# Patient Record
Sex: Female | Born: 1995 | Race: White | Hispanic: No | Marital: Single | Smoking: Former smoker
Health system: Southern US, Community
[De-identification: ages and names within clinical notes are randomized; demographics above are authoritative.]

## PROBLEM LIST (undated history)

## (undated) DIAGNOSIS — J302 Other seasonal allergic rhinitis: Secondary | ICD-10-CM

## (undated) DIAGNOSIS — J45909 Unspecified asthma, uncomplicated: Secondary | ICD-10-CM

---
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
## 2015-09-05 ENCOUNTER — Encounter (HOSPITAL_COMMUNITY): Payer: Self-pay | Admitting: Emergency Medicine

## 2015-09-05 ENCOUNTER — Emergency Department (HOSPITAL_COMMUNITY): Payer: Medicaid Other

## 2015-09-05 ENCOUNTER — Emergency Department (HOSPITAL_COMMUNITY)
Admission: EM | Admit: 2015-09-05 | Discharge: 2015-09-05 | Disposition: A | Discharge status: Home / Self Care | Payer: Medicaid Other | Attending: Emergency Medicine | Admitting: Emergency Medicine

## 2015-09-05 DIAGNOSIS — Z3202 Encounter for pregnancy test, result negative: Secondary | ICD-10-CM | POA: Insufficient documentation

## 2015-09-05 DIAGNOSIS — M62830 Muscle spasm of back: Secondary | ICD-10-CM

## 2015-09-05 DIAGNOSIS — Z79899 Other long term (current) drug therapy: Secondary | ICD-10-CM | POA: Insufficient documentation

## 2015-09-05 DIAGNOSIS — E739 Lactose intolerance, unspecified: Secondary | ICD-10-CM | POA: Diagnosis not present

## 2015-09-05 DIAGNOSIS — R109 Unspecified abdominal pain: Secondary | ICD-10-CM | POA: Diagnosis present

## 2015-09-05 DIAGNOSIS — R1013 Epigastric pain: Secondary | ICD-10-CM | POA: Insufficient documentation

## 2015-09-05 DIAGNOSIS — J45909 Unspecified asthma, uncomplicated: Secondary | ICD-10-CM | POA: Insufficient documentation

## 2015-09-05 LAB — URINALYSIS, ROUTINE W REFLEX MICROSCOPIC
Bilirubin Urine: NEGATIVE
GLUCOSE, UA: NEGATIVE mg/dL
Leukocytes, UA: NEGATIVE
Nitrite: NEGATIVE
PROTEIN: NEGATIVE mg/dL
SPECIFIC GRAVITY, URINE: 1.015 (ref 1.005–1.030)
pH: 7 (ref 5.0–8.0)

## 2015-09-05 LAB — CBC WITH DIFFERENTIAL/PLATELET
Basophils Absolute: 0 10*3/uL (ref 0.0–0.1)
Basophils Relative: 0 %
Eosinophils Absolute: 0.1 10*3/uL (ref 0.0–0.7)
Eosinophils Relative: 1 %
HEMATOCRIT: 40.1 % (ref 36.0–46.0)
HEMOGLOBIN: 13.3 g/dL (ref 12.0–15.0)
LYMPHS PCT: 32 %
Lymphs Abs: 3.3 10*3/uL (ref 0.7–4.0)
MCH: 28.2 pg (ref 26.0–34.0)
MCHC: 33.2 g/dL (ref 30.0–36.0)
MCV: 85.1 fL (ref 78.0–100.0)
MONO ABS: 0.5 10*3/uL (ref 0.1–1.0)
MONOS PCT: 5 %
NEUTROS ABS: 6.3 10*3/uL (ref 1.7–7.7)
Neutrophils Relative %: 62 %
Platelets: 312 10*3/uL (ref 150–400)
RBC: 4.71 MIL/uL (ref 3.87–5.11)
RDW: 12.7 % (ref 11.5–15.5)
WBC: 10.2 10*3/uL (ref 4.0–10.5)

## 2015-09-05 LAB — COMPREHENSIVE METABOLIC PANEL
ALBUMIN: 4.4 g/dL (ref 3.5–5.0)
ALT: 25 U/L (ref 14–54)
AST: 21 U/L (ref 15–41)
Alkaline Phosphatase: 71 U/L (ref 38–126)
Anion gap: 7 (ref 5–15)
BUN: 10 mg/dL (ref 6–20)
CHLORIDE: 106 mmol/L (ref 101–111)
CO2: 27 mmol/L (ref 22–32)
Calcium: 9.3 mg/dL (ref 8.9–10.3)
Creatinine, Ser: 0.81 mg/dL (ref 0.44–1.00)
GFR calc Af Amer: >60 mL/min (ref >60)
GFR calc non Af Amer: >60 mL/min (ref >60)
GLUCOSE: 107 mg/dL — AB (ref 65–99)
POTASSIUM: 3.4 mmol/L — AB (ref 3.5–5.1)
SODIUM: 140 mmol/L (ref 135–145)
Total Bilirubin: 0.5 mg/dL (ref 0.3–1.2)
Total Protein: 7.7 g/dL (ref 6.5–8.1)

## 2015-09-05 LAB — URINE MICROSCOPIC-ADD ON

## 2015-09-05 LAB — LIPASE, BLOOD: Lipase: 32 U/L (ref 11–51)

## 2015-09-05 LAB — POC URINE PREG, ED: Preg Test, Ur: NEGATIVE

## 2015-09-05 MED ORDER — ONDANSETRON HCL 4 MG/2ML IJ SOLN
4.0000 mg | Freq: Once | INTRAMUSCULAR | Status: AC
  Administered 2015-09-05: 4 mg via INTRAVENOUS
  Filled 2015-09-05: qty 2

## 2015-09-05 MED ORDER — SODIUM CHLORIDE 0.9 % IV BOLUS (SEPSIS)
1000.0000 mL | Freq: Once | INTRAVENOUS | Status: AC
  Administered 2015-09-05: 1000 mL via INTRAVENOUS

## 2015-09-05 MED ORDER — KETOROLAC TROMETHAMINE 30 MG/ML IJ SOLN
30.0000 mg | Freq: Once | INTRAMUSCULAR | Status: AC
  Administered 2015-09-05: 30 mg via INTRAVENOUS
  Filled 2015-09-05: qty 1

## 2015-09-05 MED ORDER — CYCLOBENZAPRINE HCL 10 MG PO TABS
10.0000 mg | ORAL_TABLET | Freq: Once | ORAL | Status: AC
  Administered 2015-09-05: 10 mg via ORAL
  Filled 2015-09-05: qty 1

## 2015-09-05 MED ORDER — NAPROXEN 500 MG PO TABS: ORAL_TABLET | ORAL | Status: DC

## 2015-09-05 MED ORDER — CYCLOBENZAPRINE HCL 5 MG PO TABS: 5.0000 mg | ORAL_TABLET | Freq: Three times a day (TID) | ORAL | Status: DC | PRN

## 2015-09-05 NOTE — ED Notes (Signed)
Pt c/o left flank pain that radiates to the abd since earlier tonight.

## 2015-09-05 NOTE — Discharge Instructions (Signed)
Use ice and heat over the sore muscles in your back. Take the medications as prescribed. Look at the back injury prevention instructions. Start taking lactaid when you ingest food or drink with dairy products in them. Recheck if you get a fever, your pain seems worse, you have uncontrolled vomiting or you seem worse.    Back Injury Prevention Back injuries can be very painful. They can also be difficult to heal. After having one back injury, you are more likely to injure your back again. It is important to learn how to avoid injuring or re-injuring your back. The following tips can help you to prevent a back injury. WHAT SHOULD I KNOW ABOUT PHYSICAL FITNESS?  Exercise for 30 minutes per day on most days of the week or as told by your doctor. Make sure to:  Do aerobic exercises, such as walking, jogging, biking, or swimming.  Do exercises that increase balance and strength, such as tai chi and yoga.  Do stretching exercises. This helps with flexibility.  Try to develop strong belly (abdominal) muscles. Your belly muscles help to support your back.  Stay at a healthy weight. This helps to decrease your risk of a back injury. WHAT SHOULD I KNOW ABOUT MY DIET?  Talk with your doctor about your overall diet. Take supplements and vitamins only as told by your doctor.  Talk with your doctor about how much calcium and vitamin D you need each day. These nutrients help to prevent weakening of the bones (osteoporosis).  Include good sources of calcium in your diet, such as:  Dairy products.  Green leafy vegetables.  Products that have had calcium added to them (fortified).  Include good sources of vitamin D in your diet, such as:  Milk.  Foods that have had vitamin D added to them. WHAT SHOULD I KNOW ABOUT MY POSTURE?  Sit up straight and stand up straight. Avoid leaning forward when you sit or hunching over when you stand.  Choose chairs that have good low-back (lumbar) support.  If  you work at a desk, sit close to it so you do not need to lean over. Keep your chin tucked in. Keep your neck drawn back. Keep your elbows bent so your arms look like the letter "L" (right angle).  Sit high and close to the steering wheel when you drive. Add a low-back support to your car seat, if needed.  Avoid sitting or standing in one position for very long. Take breaks to get up, stretch, and walk around at least one time every hour. Take breaks every hour if you are driving for long periods of time.  Sleep on your side with your knees slightly bent, or sleep on your back with a pillow under your knees. Do not lie on the front of your body to sleep. WHAT SHOULD I KNOW ABOUT LIFTING, TWISTING, AND REACHING Lifting and Heavy Lifting  Avoid heavy lifting, especially lifting over and over again. If you must do heavy lifting:  Stretch before lifting.  Work slowly.  Rest between lifts.  Use a tool such as a cart or a dolly to move objects if one is available.  Make several small trips instead of carrying one heavy load.  Ask for help when you need it, especially when moving big objects.  Follow these steps when lifting:  Stand with your feet shoulder-width apart.  Get as close to the object as you can. Do not pick up a heavy object that is far from your  body.  Use handles or lifting straps if they are available.  Bend at your knees. Squat down, but keep your heels off the floor.  Keep your shoulders back. Keep your chin tucked in. Keep your back straight.  Lift the object slowly while you tighten the muscles in your legs, belly, and butt. Keep the object as close to the center of your body as possible.  Follow these steps when putting down a heavy load:  Stand with your feet shoulder-width apart.  Lower the object slowly while you tighten the muscles in your legs, belly, and butt. Keep the object as close to the center of your body as possible.  Keep your shoulders back.  Keep your chin tucked in. Keep your back straight.  Bend at your knees. Squat down, but keep your heels off the floor.  Use handles or lifting straps if they are available. Twisting and Reaching  Avoid lifting heavy objects above your waist.  Do not twist at your waist while you are lifting or carrying a load. If you need to turn, move your feet.  Do not bend over without bending at your knees.  Avoid reaching over your head, across a table, or for an object on a high surface.  WHAT ARE SOME OTHER TIPS?  Avoid wet floors and icy ground. Keep sidewalks clear of ice to prevent falls.   Do not sleep on a mattress that is too soft or too hard.   Keep items that you use often within easy reach.   Put heavier objects on shelves at waist level, and put lighter objects on lower or higher shelves.  Find ways to lower your stress, such as:  Exercise.  Massage.  Relaxation techniques.  Talk with your doctor if you feel anxious or depressed. These conditions can make back pain worse.  Wear flat heel shoes with cushioned soles.  Avoid making quick (sudden) movements.  Use both shoulder straps when carrying a backpack.  Do not use any tobacco products, including cigarettes, chewing tobacco, or electronic cigarettes. If you need help quitting, ask your doctor.   This information is not intended to replace advice given to you by your health care provider. Make sure you discuss any questions you have with your health care provider.   Document Released: 02/14/2008 Document Revised: 01/12/2015 Document Reviewed: 09/01/2014 Elsevier Interactive Patient Education 2016 [place].  Back Exercises If you have pain in your back, do these exercises 2-3 times each day or as told by your doctor. When the pain goes away, do the exercises once each day, but repeat the steps more times for each exercise (do more repetitions). If you do not have pain in your back, do these exercises once  each day or as told by your doctor. EXERCISES Single Knee to Chest Do these steps 3-5 times in a row for each leg:  Lie on your back on a firm bed or the floor with your legs stretched out.  Bring one knee to your chest.  Hold your knee to your chest by grabbing your knee or thigh.  Pull on your knee until you feel a gentle stretch in your lower back.  Keep doing the stretch for 10-30 seconds.  Slowly let go of your leg and straighten it. Pelvic Tilt Do these steps 5-10 times in a row:  Lie on your back on a firm bed or the floor with your legs stretched out.  Bend your knees so they point up to the ceiling.  Your feet should be flat on the floor.  Tighten your lower belly (abdomen) muscles to press your lower back against the floor. This will make your tailbone point up to the ceiling instead of pointing down to your feet or the floor.  Stay in this position for 5-10 seconds while you gently tighten your muscles and breathe evenly. Cat-Cow Do these steps until your lower back bends more easily:  Get on your hands and knees on a firm surface. Keep your hands under your shoulders, and keep your knees under your hips. You may put padding under your knees.  Let your head hang down, and make your tailbone point down to the floor so your lower back is round like the back of a cat.  Stay in this position for 5 seconds.  Slowly lift your head and make your tailbone point up to the ceiling so your back hangs low (sags) like the back of a cow.  Stay in this position for 5 seconds. Press-Ups Do these steps 5-10 times in a row:  Lie on your belly (face-down) on the floor.  Place your hands near your head, about shoulder-width apart.  While you keep your back relaxed and keep your hips on the floor, slowly straighten your arms to raise the top half of your body and lift your shoulders. Do not use your back muscles. To make yourself more comfortable, you may change where you place your  hands.  Stay in this position for 5 seconds.  Slowly return to lying flat on the floor. Bridges Do these steps 10 times in a row:  Lie on your back on a firm surface.  Bend your knees so they point up to the ceiling. Your feet should be flat on the floor.  Tighten your butt muscles and lift your butt off of the floor until your waist is almost as high as your knees. If you do not feel the muscles working in your butt and the back of your thighs, slide your feet 1-2 inches farther away from your butt.  Stay in this position for 3-5 seconds.  Slowly lower your butt to the floor, and let your butt muscles relax. If this exercise is too easy, try doing it with your arms crossed over your chest. Belly Crunches Do these steps 5-10 times in a row:  Lie on your back on a firm bed or the floor with your legs stretched out.  Bend your knees so they point up to the ceiling. Your feet should be flat on the floor.  Cross your arms over your chest.  Tip your chin a little bit toward your chest but do not bend your neck.  Tighten your belly muscles and slowly raise your chest just enough to lift your shoulder blades a tiny bit off of the floor.  Slowly lower your chest and your head to the floor. Back Lifts Do these steps 5-10 times in a row: 1. Lie on your belly (face-down) with your arms at your sides, and rest your forehead on the floor. 2. Tighten the muscles in your legs and your butt. 3. Slowly lift your chest off of the floor while you keep your hips on the floor. Keep the back of your head in line with the curve in your back. Look at the floor while you do this. 4. Stay in this position for 3-5 seconds. 5. Slowly lower your chest and your face to the floor. GET HELP IF:  Your back pain gets  a lot worse when you do an exercise.  Your back pain does not lessen 2 hours after you exercise. If you have any of these problems, stop doing the exercises. Do not do them again unless your  doctor says it is okay. GET HELP RIGHT AWAY IF:  You have sudden, very bad back pain. If this happens, stop doing the exercises. Do not do them again unless your doctor says it is okay.   This information is not intended to replace advice given to you by your health care provider. Make sure you discuss any questions you have with your health care provider.   Document Released: 09/30/2010 Document Revised: 05/19/2015 Document Reviewed: 10/22/2014 Elsevier Interactive Patient Education 2016 [place] therapy can help ease sore, stiff, injured, and tight muscles and joints. Heat relaxes your muscles, which may help ease your pain. Heat therapy should only be used on old, pre-existing, or long-lasting (chronic) injuries. Do not use heat therapy unless told by your doctor. HOW TO USE HEAT THERAPY There are several different kinds of heat therapy, including:  Moist heat pack.  Warm water bath.  Hot water bottle.  Electric heating pad.  Heated gel pack.  Heated wrap.  Electric heating pad. GENERAL HEAT THERAPY RECOMMENDATIONS   Do not sleep while using heat therapy. Only use heat therapy while you are awake.  Your skin may turn pink while using heat therapy. Do not use heat therapy if your skin turns red.  Do not use heat therapy if you have new pain.  High heat or long exposure to heat can cause burns. Be careful when using heat therapy to avoid burning your skin.  Do not use heat therapy on areas of your skin that are already irritated, such as with a rash or sunburn. GET HELP IF:   You have blisters, redness, swelling (puffiness), or numbness.  You have new pain.  Your pain is worse. MAKE SURE YOU:  Understand these instructions.  Will watch your condition.  Will get help right away if you are not doing well or get worse.   This information is not intended to replace advice given to you by your health care provider. Make sure you discuss any  questions you have with your health care provider.   Document Released: 11/20/2011 Document Revised: 09/18/2014 Document Reviewed: 10/21/2013 Elsevier Interactive Patient Education 2016 [employer] Inc[employer]  Lactose-Free Diet, Adult If you have lactose intolerance, you are not able to digest lactose. Lactose is a natural sugar found mainly in milk and milk products. You may need to avoid all foods and beverages that contain lactose. A lactose-free diet can help you do this.  WHAT DO I NEED TO KNOW ABOUT THIS DIET?  Do not consume foods, beverages, vitamins, minerals, or medicines with lactose. Read ingredients lists carefully.  Look for the words "lactose-free" on labels.  Use lactase enzyme drops or tablets as directed by your health care provider.  Use lactose-free milk or a milk alternative, such as soy milk, for drinking and cooking.  Make sure you get enough calcium and vitamin D in your diet. A lactose-free eating plan can be lacking in these important nutrients.  Take calcium and vitamin D supplements as directed by your health care provider. Talk to your provider about supplements if you are not able to get enough calcium and vitamin D from food. WHICH FOODS HAVE LACTOSE? Lactose is found in:   Milk and foods made from milk.  Yogurt.  Cheese.  Butter.   Margarine.   Sour cream.   Cream.   Whipped toppings and nondairy creamers.  Ice cream and other milk-based desserts. Lactose is also found in foods or products made with milk or milk ingredients. To find out whether a food contains milk or a milk ingredient, look at the ingredients list. Avoid foods with the statement "May contain milk" and foods that contain:   Butter.   Cream.  Milk.  Milk solids.  Milk powder.   Whey.  Curd.  Caseinate.  Lactose.  Lactalbumin.  Lactoglobulin. WHAT ARE SOME ALTERNATIVES TO MILK AND FOODS MADE WITH MILK PRODUCTS?  Lactose-free milk.  Soy milk with  added calcium and vitamin D.  Almond, coconut, or rice milk with added calcium and vitamin D. Note that these are low in protein.   Soy products, such as soy yogurt, soy cheese, soy ice cream, and soy-based sour cream. WHICH FOODS CAN I EAT? Grains Breads and rolls made without milk, such as [place], [place], or [place] bread, bagels, pita, and [employer]. Corn tortillas, corn meal, grits, and polenta. Crackers without lactose or milk solids, such as soda crackers and graham crackers. Cooked or dry cereals without lactose or milk solids. Pasta, quinoa, couscous, barley, oats, bulgur, farro, rice, wild rice, or other grains prepared without milk or lactose. Plain popcorn.  Vegetables Fresh, frozen, and canned vegetables without cheese, cream, or butter sauces. Fruits All fresh, canned, frozen, or dried fruits that are not processed with lactose. Meats and Other Protein Sources Plain beef, chicken, fish, [place], lamb, veal, pork, wild game, or ham. Kosher-prepared meat products. Strained or junior meats that do not contain milk. Eggs. Soy meat substitutes. Beans, lentils, and hummus. Tofu. Nuts and seeds. Peanut or other nut butters without lactose. Soups, casseroles, and mixed dishes without cheese, cream, or milk.  Dairy Lactose-free milk. Soy, rice, or almond milk with added calcium and vitamin D. Soy cheese and yogurt. Beverages Carbonated drinks. Tea. Coffee, freeze-dried coffee, and some instant coffees. Fruit and vegetable juices.  Condiments Soy sauce. Carob powder. Olives. Gravy made with water. Baker's cocoa. Angie Fava. Pure seasonings and spices. Ketchup. Mustard. Bouillon. Broth.  Sweets and Desserts Water and fruit ices. Gelatin. Cookies, pies, or cakes made from allowed ingredients, such as angel food cake. Pudding made with water or a milk substitute. Lactose-free tofu desserts. Soy, coconut milk, or rice-milk-based frozen desserts. Sugar. Honey. Jam, jelly, and marmalade. Molasses. Pure  sugar candy. Dark chocolate without milk. Marshmallows.  Fats and Oils Margarines and salad dressings that do not contain milk. Berniece Salines. Vegetable oils. Shortening. Mayonnaise. Soy or coconut-based cream.  The items listed above may not be a complete list of recommended foods or beverages. Contact your dietitian for more options.  WHICH FOODS ARE NOT RECOMMENDED? Grains Breads and rolls that contain milk. Toaster pastries. Muffins, biscuits, waffles, cornbread, and pancakes. These can be prepared at home, commercial, or from mixes. Sweet rolls, donuts, English muffins, fry bread, lefse, flour tortillas with lactose, or [place] toast made with milk or milk ingredients. Crackers that contain lactose. Corn curls. Cooked or dry cereals with lactose. Vegetables Creamed or breaded vegetables. Vegetables in a cheese or butter sauce or with lactose-containing margarines. Instant potatoes. [place] fries. Scalloped or au gratin potatoes.  Fruits None.  Meats and Other Protein Sources Scrambled eggs, omelets, and souffles that contain milk. Creamed or breaded meat, fish, chicken, or [place]. Sausage products, such as wieners and liver sausage. Cold cuts that contain milk solids. Cheese,  cottage cheese, ricotta cheese, and cheese spreads. Lasagna and macaroni and cheese. Pizza. Peanut or other nut butters with added milk solids. Casseroles or mixed dishes containing milk or cheese.  Dairy All dairy products, including milk, goat's milk, buttermilk, kefir, acidophilus milk, flavored milk, evaporated milk, condensed milk, dulce de [place], eggnog, yogurt, cheese, and cheese spreads.  Beverages Hot chocolate. Cocoa with lactose. Instant iced teas. Powdered fruit drinks. Smoothies made with milk or yogurt.  Condiments Chewing gum that has lactose. Cocoa that has lactose. Spice blends if they contain milk products. Artificial sweeteners that contain lactose. Nondairy creamers.  Sweets and Desserts Ice cream, ice milk,  gelato, sherbet, and frozen yogurt. Custard, pudding, and mousse. Cake, cream pies, cookies, and other desserts containing milk, cream, cream cheese, or milk chocolate. Pie crust made with milk-containing margarine or butter. Reduced-calorie desserts made with a sugar substitute that contains lactose. Toffee and butterscotch. Milk, white, or dark chocolate that contains milk. Fudge. Caramel.  Fats and Oils Margarines and salad dressings that contain milk or cheese. Cream. Half and half. Cream cheese. Sour cream. Chip dips made with sour cream or yogurt.  The items listed above may not be a complete list of foods and beverages to avoid. Contact your dietitian for more information. AM I GETTING ENOUGH CALCIUM? Calcium is found in many foods that contain lactose and is important for bone health. The amount of calcium you need depends on your age:   Adults younger than 50 years: 1000 mg of calcium a day.  Adults older than 50 years: 1200 mg of calcium a day. If you are not getting enough calcium, other calcium sources include:   Orange juice with calcium added. There are 300-350 mg of calcium in 1 cup of orange juice.   Sardines with edible bones. There are 325 mg of calcium in 3 oz of sardines.   Calcium-fortified soy milk. There are 300-400 mg of calcium in 1 cup of calcium-fortified soy milk.  Calcium-fortified rice or almond milk. There are 300 mg of calcium in 1 cup of calcium-fortified rice or almond milk.  Canned salmon with edible bones. There are 180 mg of calcium in 3 oz of canned salmon with edible bones.   Calcium-fortified breakfast cereals. There are [phone] mg of calcium in calcium-fortified breakfast cereals.   Tofu set with calcium sulfate. There are 250 mg of calcium in  cup of tofu set with calcium sulfate.  Spinach, cooked. There are 145 mg of calcium in  cup of cooked spinach.  Edamame, cooked. There are 130 mg of calcium in  cup of cooked edamame.  Collard  greens, cooked. There are 125 mg of calcium in  cup of cooked collard greens.  Kale, frozen or cooked. There are 90 mg of calcium in  cup of cooked or frozen kale.   Almonds. There are 95 mg of calcium in  cup of almonds.  Broccoli, cooked. There are 60 mg of calcium in 1 cup of cooked broccoli.   This information is not intended to replace advice given to you by your health care provider. Make sure you discuss any questions you have with your health care provider.   Document Released: 02/17/2002 Document Revised: 01/12/2015 Document Reviewed: 11/28/2013 Elsevier Interactive Patient Education 2016 Elsevier Inc[employer]  Lactose Intolerance, Adult Lactose is the natural sugar found in milk and milk products, such as cheese and yogurt. Lactose is digested by lactase, an enzyme in your small intestine. Some people do not produce enough lactase  to digest lactose. This is called lactose intolerance. Lactose intolerance is different from milk allergy, which is a more serious reaction to the protein in milk.  CAUSES Causes of lactose intolerance may include:   Normal aging. The ability to produce lactase may decline with age, causing lactose intolerance over time.  Being born without the ability to make lactase.   Digestive diseases such as gastroenteritis or inflammatory bowel disease.  Surgery or injuries to your small intestine.  Infection in your intestines.  Certain antibiotic medicines and cancer treatments. SIGNS AND SYMPTOMS  Lactose intolerance can cause uncomfortable symptoms. These are likely to occur within 30 minutes to 2 hours after eating or drinking foods containing lactose. Symptoms of lactose intolerance may include:  Nausea.  Diarrhea.  Abdominal cramps or pain.  Bloating.   Gas.  DIAGNOSIS  There are several tests your health care provider can do to diagnose lactose intolerance. These tests include a hydrogen breath test and stool acidity test.  TREATMENT  No  treatment can improve your body's ability to produce lactase. However, your symptoms can be controlled by limiting or avoiding milk products and other sources of lactose and adjusting your diet. Lactose-free milk is often tolerated. Lactose digestion may also be improved by adding lactase drops to regular milk or by taking lactase tablets when dairy products are consumed. Tolerance to lactose is individual. Some people may be able to eat or drink small amounts of products with lactose, while other may need to avoid lactose entirely. Talk to your health care provider about what is best for you.  HOME CARE INSTRUCTIONS  Limit or avoidfoods, beverages, and medicines containing lactose as directed by your health care provider.   Read food and medicine labels carefully to avoid products containing lactose, milk solids, casein, or whey.  If you eliminate dairy products, replace the protein, calcium, vitamin D, and other nutrients they contain through other foods. A registered dietitian or your health care provider can help you adjust your diet.  Choose a milk substitute that is fortified with calcium and vitamin D. Be aware that soy milk contains high quality protein, while milks made from nuts or grains contain very little protein.  Use lactase drops or tablets if directed by your health care provider. SEEK MEDICAL CARE IF: You have no relief from your symptoms after eliminating milk products and other sources of lactose.    This information is not intended to replace advice given to you by your health care provider. Make sure you discuss any questions you have with your health care provider.   Document Released: 08/28/2005 Document Revised: 09/18/2014 Document Reviewed: 11/28/2013 Elsevier Interactive Patient Education 2016 [employer] Inc[employer]  Muscle Cramps and Spasms Muscle cramps and spasms are when muscles tighten by themselves. They usually get better within minutes. Muscle cramps are painful.  They are usually stronger and last longer than muscle spasms. Muscle spasms may or may not be painful. They can last a few seconds or much longer. HOME CARE  Drink enough fluid to keep your pee (urine) clear or pale yellow.  Massage, stretch, and relax the muscle.  Use a warm towel, heating pad, or warm shower water on tight muscles.  Place ice on the muscle if it is tender or in pain.  Put ice in a plastic bag.  Place a towel between your skin and the bag.  Leave the ice on for 15-20 minutes, 03-04 times a day.  Only take medicine as told by your doctor. GET  HELP RIGHT AWAY IF:  Your cramps or spasms get worse, happen more often, or do not get better with time. MAKE SURE YOU:  Understand these instructions.  Will watch your condition.  Will get help right away if you are not doing well or get worse.   This information is not intended to replace advice given to you by your health care provider. Make sure you discuss any questions you have with your health care provider.   Document Released: 08/10/2008 Document Revised: 12/23/2012 Document Reviewed: 08/14/2012 Elsevier Interactive Patient Education [employer].

## 2015-09-05 NOTE — ED Provider Notes (Signed)
CSN: [id number]     Arrival date & time 09/05/15  0053 History   First MD Initiated Contact with Patient 09/05/15 0129   Chief Complaint  Patient presents with  . Flank Pain     (Consider location/radiation/quality/duration/timing/severity/associated sxs/prior Treatment) HPI patient reports all day today at work she had some pain in her midline lower back. She states it hurts when she moves. She also states sometimes deep breathing makes it hurt. About 12 midnight she was lying in bed trying to go to sleep and she started getting epigastric pain that then progressed and now is like a band that goes all the way around her upper abdomen and across her back. She states the pain is a tightness like a "stretched muscle". She has had nausea and vomiting without diarrhea. She denies any fever. She states the only thing she ate different today was she ate fudge which she doesn't normally eat about 10 PM. She states she's been noticing the past month whenever she eats dairy products she gets a lot of gas. She states her boyfriend gave her lactaide, a ranitidine, a gas relief pill, and Aleve tonight without significant improvement of her discomfort. She states tinnitus first night she has had this pain.   PCP [facility]  Dr Sherlyn Lick  Past Medical History  Diagnosis Date  . Asthma   . Seasonal allergies    History reviewed. No pertinent past surgical history. History reviewed. No pertinent family history. Social History  Substance Use Topics  . Smoking status: Never Smoker   . Smokeless tobacco: None  . Alcohol Use: Yes   no alcohol tonight Employed Living with boyfriend  OB History    No data available     Review of Systems  All other systems reviewed and are negative.     Allergies  Sulfa antibiotics  Home Medications   Prior to Admission medications   Medication Sig Start Date End Date Taking? Authorizing Provider  albuterol (PROVENTIL HFA;VENTOLIN HFA) 108 (90 BASE) MCG/ACT inhaler  Inhale 2 puffs into the lungs every 6 (six) hours as needed. For shortness of breath    Historical Provider, MD  albuterol (PROVENTIL HFA;VENTOLIN HFA) 108 (90 BASE) MCG/ACT inhaler Inhale 2 puffs into the lungs every 4 (four) hours as needed for wheezing. 07/04/12 08/10/12  Tamika Bush, DO  cyclobenzaprine (FLEXERIL) 5 MG tablet Take 1 tablet (5 mg total) by mouth 3 (three) times daily as needed. 09/05/15   Devoria Albe, MD  dextromethorphan-guaiFENesin (MUCINEX DM) 30-600 MG per 12 hr tablet Take 1 tablet by mouth every 12 (twelve) hours.    Historical Provider, MD  naproxen (NAPROSYN) 500 MG tablet Take 1 po BID with food prn pain 09/05/15   Devoria Albe, MD   BP 146/86 mmHg  Pulse 82  Temp(Src) 98.7 F (37.1 C) (Oral)  Resp 20  Ht  (1.778 m)  Wt 235 lb (106.595 kg)  BMI 33.72 kg/m2  SpO2 100%  Vital signs normal   Physical Exam  Constitutional: She is oriented to person, place, and time. She appears well-developed and well-nourished.  Non-toxic appearance. She does not appear ill. No distress.  HENT:  Head: Normocephalic and atraumatic.  Right Ear: External ear normal.  Left Ear: External ear normal.  Nose: Nose normal. No mucosal edema or rhinorrhea.  Mouth/Throat: Oropharynx is clear and moist and mucous membranes are normal. No dental abscesses or uvula swelling.  Eyes: Conjunctivae and EOM are normal. Pupils are equal, round, and reactive to light.  Neck: Normal range of motion and full passive range of motion without pain. Neck supple.  Cardiovascular: Normal rate, regular rhythm and normal heart sounds.  Exam reveals no gallop and no friction rub.   No murmur heard. Pulmonary/Chest: Effort normal and breath sounds normal. No respiratory distress. She has no wheezes. She has no rhonchi. She has no rales. She exhibits no tenderness and no crepitus.  Abdominal: Soft. Normal appearance and bowel sounds are normal. She exhibits no distension. There is tenderness. There is no  rebound and no guarding.    Tender in epigastric region, band of pain noted as reported by patient.   Musculoskeletal: Normal range of motion. She exhibits no edema or tenderness.       Back:  Moves all extremities well.  Patient has tenderness in her lumbar spine and over her left paraspinous muscles. On range of motion of her waist she has pain in the paraspinous muscles on the left especially with range of motion to left, also on the right but not as intense as when she moves to the left.  Neurological: She is alert and oriented to person, place, and time. She has normal strength. No cranial nerve deficit.  Skin: Skin is warm, dry and intact. No rash noted. No erythema. No pallor.  Psychiatric: She has a normal mood and affect. Her speech is normal and behavior is normal. Her mood appears not anxious.  Nursing note and vitals reviewed.   ED Course  Procedures (including critical care time)  Medications  cyclobenzaprine (FLEXERIL) tablet 10 mg (not administered)  sodium chloride 0.9 % bolus 1,000 mL (1,000 mLs Intravenous New Bag/Given 09/05/15 0226)  ondansetron (ZOFRAN) injection 4 mg (4 mg Intravenous Given 09/05/15 0228)  ketorolac (TORADOL) 30 MG/ML injection 30 mg (30 mg Intravenous Given 09/05/15 0229)   Patient was given IV fluids and given Toradol for her musculoskeletal pain. She also was given Zofran for nausea.  At time of discharge patient states her pain is improved. We discussed her test results. She will be treated for musculoskeletal pain of her left paraspinous muscles of her lumbar spine. We also discussed starting taking Lactaid before she eats foods or drinks to have milk products since she has noticed milk is causing her GI distress. She can follow-up with her PCP if she's not improving.   Labs Review Results for orders placed or performed during the hospital encounter of 09/05/15  Urinalysis, Routine w reflex microscopic (not at [facility])  Result Value Ref Range     Color, Urine YELLOW YELLOW   APPearance CLEAR CLEAR   Specific Gravity, Urine 1.015 1.005 - 1.030   pH 7.0 5.0 - 8.0   Glucose, UA NEGATIVE NEGATIVE mg/dL   Hgb urine dipstick LARGE (A) NEGATIVE   Bilirubin Urine NEGATIVE NEGATIVE   Ketones, ur TRACE (A) NEGATIVE mg/dL   Protein, ur NEGATIVE NEGATIVE mg/dL   Nitrite NEGATIVE NEGATIVE   Leukocytes, UA NEGATIVE NEGATIVE  Urine microscopic-add on  Result Value Ref Range   Squamous Epithelial / LPF 6-30 (A) NONE SEEN   WBC, UA 0-5 0 - 5 WBC/hpf   RBC / HPF TOO NUMEROUS TO COUNT 0 - 5 RBC/hpf   Bacteria, UA MANY (A) NONE SEEN  Comprehensive metabolic panel  Result Value Ref Range   Sodium 140 135 - 145 mmol/L   Potassium 3.4 (L) 3.5 - 5.1 mmol/L   Chloride 106 101 - 111 mmol/L   CO2 27 22 - 32 mmol/L   Glucose, Bld  107 (H) 65 - 99 mg/dL   BUN 10 6 - 20 mg/dL   Creatinine, Ser 1.610.81 0.44 - 1.00 mg/dL   Calcium 9.3 8.9 - 09.610.3 mg/dL   Total Protein 7.7 6.5 - 8.1 g/dL   Albumin 4.4 3.5 - 5.0 g/dL   AST 21 15 - 41 U/L   ALT 25 14 - 54 U/L   Alkaline Phosphatase 71 38 - 126 U/L   Total Bilirubin 0.5 0.3 - 1.2 mg/dL   GFR calc non Af Amer >60 >60 mL/min   GFR calc Af Amer >60 >60 mL/min   Anion gap 7 5 - 15  Lipase, blood  Result Value Ref Range   Lipase 32 11 - 51 U/L  CBC with Differential  Result Value Ref Range   WBC 10.2 4.0 - 10.5 K/uL   RBC 4.71 3.87 - 5.11 MIL/uL   Hemoglobin 13.3 12.0 - 15.0 g/dL   HCT 04.540.1 40.936.0 - 81.146.0 %   MCV 85.1 78.0 - 100.0 fL   MCH 28.2 26.0 - 34.0 pg   MCHC 33.2 30.0 - 36.0 g/dL   RDW 91.412.7 78.211.5 - 95.615.5 %   Platelets 312 150 - 400 K/uL   Neutrophils Relative % 62 %   Neutro Abs 6.3 1.7 - 7.7 K/uL   Lymphocytes Relative 32 %   Lymphs Abs 3.3 0.7 - 4.0 K/uL   Monocytes Relative 5 %   Monocytes Absolute 0.5 0.1 - 1.0 K/uL   Eosinophils Relative 1 %   Eosinophils Absolute 0.1 0.0 - 0.7 K/uL   Basophils Relative 0 %   Basophils Absolute 0.0 0.0 - 0.1 K/uL  POC urine preg, [facility] ([facility])   Result Value Ref Range   Preg Test, Ur NEGATIVE NEGATIVE   Laboratory interpretation all normal except hematuria     Imaging Review Ct Renal Stone Study  09/05/2015  CLINICAL DATA:  19 year old female with intermittent left flank pain EXAM: CT ABDOMEN AND PELVIS WITHOUT CONTRAST TECHNIQUE: Multidetector CT imaging of the abdomen and pelvis was performed following the standard protocol without IV contrast. COMPARISON:  None. FINDINGS: Evaluation of this exam is limited in the absence of intravenous contrast. The visualized lung bases are clear. No intra-abdominal free air or free fluid. Mildly distended gallbladder. No calcified stone or pericholecystic fluid. The liver, pancreas, spleen, and adrenal glands appear unremarkable. There is no hydronephrosis or nephrolithiasis. The urinary bladder appears unremarkable. The uterus and the left ovary appear grossly unremarkable. The right ovary is slightly prominent. No inflammatory changes. There is no evidence of bowel obstruction or inflammation. Normal appendix. The abdominal aorta and IVC appear grossly unremarkable on this noncontrast study. No portal venous gas identified. Multiple small mesenteric lymph nodes noted, nonspecific. There is no adenopathy. The abdominal wall soft tissues appear unremarkable. The osseous structures are intact. IMPRESSION: No hydronephrosis or nephrolithiasis. No evidence of bowel obstruction or inflammation.  Normal appendix. Electronically Signed   By: Elgie CollardArash  Radparvar M.D.   On: 09/05/2015 02:57   I have personally reviewed and evaluated these images and lab results as part of my medical decision-making.   EKG Interpretation None      MDM   Final diagnoses:  Muscle spasm of back  Epigastric abdominal pain  Lactose intolerance in adult   New Prescriptions   CYCLOBENZAPRINE (FLEXERIL) 5 MG TABLET    Take 1 tablet (5 mg total) by mouth 3 (three) times daily as needed.   NAPROXEN (NAPROSYN) 500 MG TABLET  Take 1 po BID with food prn pain  Lactaid OTC  Plan discharge  Devoria Albe, MD, Concha Pyo, MD 09/05/15 [phone]

## 2016-11-08 ENCOUNTER — Other Ambulatory Visit: Payer: Self-pay | Admitting: Obstetrics & Gynecology

## 2016-11-08 ENCOUNTER — Other Ambulatory Visit (HOSPITAL_COMMUNITY)
Admission: RE | Admit: 2016-11-08 | Discharge: 2016-11-08 | Disposition: A | Discharge status: Home / Self Care | Payer: Medicaid Other | Source: Ambulatory Visit | Attending: Obstetrics and Gynecology | Admitting: Obstetrics and Gynecology

## 2016-11-08 DIAGNOSIS — Z01419 Encounter for gynecological examination (general) (routine) without abnormal findings: Secondary | ICD-10-CM | POA: Diagnosis not present

## 2016-11-10 LAB — CYTOLOGY - PAP: DIAGNOSIS: NEGATIVE

## 2017-05-03 ENCOUNTER — Emergency Department (HOSPITAL_COMMUNITY)
Admission: EM | Admit: 2017-05-03 | Discharge: 2017-05-03 | Disposition: A | Discharge status: Home / Self Care | Payer: No Typology Code available for payment source | Attending: Emergency Medicine | Admitting: Emergency Medicine

## 2017-05-03 ENCOUNTER — Encounter (HOSPITAL_COMMUNITY): Payer: Self-pay

## 2017-05-03 DIAGNOSIS — F17200 Nicotine dependence, unspecified, uncomplicated: Secondary | ICD-10-CM | POA: Insufficient documentation

## 2017-05-03 DIAGNOSIS — S39012A Strain of muscle, fascia and tendon of lower back, initial encounter: Secondary | ICD-10-CM | POA: Diagnosis not present

## 2017-05-03 DIAGNOSIS — S3992XA Unspecified injury of lower back, initial encounter: Secondary | ICD-10-CM | POA: Diagnosis present

## 2017-05-03 DIAGNOSIS — R21 Rash and other nonspecific skin eruption: Secondary | ICD-10-CM | POA: Diagnosis not present

## 2017-05-03 DIAGNOSIS — Y999 Unspecified external cause status: Secondary | ICD-10-CM | POA: Diagnosis not present

## 2017-05-03 DIAGNOSIS — Y92410 Unspecified street and highway as the place of occurrence of the external cause: Secondary | ICD-10-CM | POA: Diagnosis not present

## 2017-05-03 DIAGNOSIS — Y9389 Activity, other specified: Secondary | ICD-10-CM | POA: Insufficient documentation

## 2017-05-03 DIAGNOSIS — J45909 Unspecified asthma, uncomplicated: Secondary | ICD-10-CM | POA: Diagnosis not present

## 2017-05-03 MED ORDER — TRIAMCINOLONE ACETONIDE 0.1 % EX CREA: 1.0000 "application " | TOPICAL_CREAM | Freq: Three times a day (TID) | CUTANEOUS | 0 refills | Status: DC

## 2017-05-03 MED ORDER — DIAZEPAM 5 MG PO TABS
10.0000 mg | ORAL_TABLET | Freq: Once | ORAL | Status: AC
  Administered 2017-05-03: 10 mg via ORAL
  Filled 2017-05-03: qty 2

## 2017-05-03 MED ORDER — CYCLOBENZAPRINE HCL 10 MG PO TABS: 10.0000 mg | ORAL_TABLET | Freq: Three times a day (TID) | ORAL | 0 refills | Status: DC

## 2017-05-03 MED ORDER — IBUPROFEN 600 MG PO TABS: 600.0000 mg | ORAL_TABLET | Freq: Four times a day (QID) | ORAL | 0 refills | Status: DC

## 2017-05-03 MED ORDER — IBUPROFEN 800 MG PO TABS
800.0000 mg | ORAL_TABLET | Freq: Once | ORAL | Status: AC
  Administered 2017-05-03: 800 mg via ORAL
  Filled 2017-05-03: qty 1

## 2017-05-03 MED ORDER — ONDANSETRON HCL 4 MG PO TABS
4.0000 mg | ORAL_TABLET | Freq: Once | ORAL | Status: AC
  Administered 2017-05-03: 4 mg via ORAL
  Filled 2017-05-03: qty 1
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
## 2017-05-03 NOTE — ED Notes (Signed)
Pt made aware to return if symptoms worsen or if any life threatening symptoms occur.   

## 2017-05-03 NOTE — Discharge Instructions (Signed)
Your examination favors lumbar area strain. Please use a heating pad to your lower back when sitting and at rest. Please rest your back is much as possible. Use Flexeril 3 times daily for spasm pain. Please use ibuprofen with breakfast, lunch, dinner, and at bedtime for pain and inflammation. Please see Dr.Swinteck Or a member of his team if not improving.

## 2017-05-17 ENCOUNTER — Encounter (HOSPITAL_COMMUNITY): Payer: Self-pay

## 2017-05-17 ENCOUNTER — Emergency Department (HOSPITAL_COMMUNITY)
Admission: EM | Admit: 2017-05-17 | Discharge: 2017-05-17 | Disposition: A | Discharge status: Home / Self Care | Payer: Self-pay | Attending: Emergency Medicine | Admitting: Emergency Medicine

## 2017-05-17 DIAGNOSIS — Y929 Unspecified place or not applicable: Secondary | ICD-10-CM | POA: Insufficient documentation

## 2017-05-17 DIAGNOSIS — T148XXA Other injury of unspecified body region, initial encounter: Secondary | ICD-10-CM

## 2017-05-17 DIAGNOSIS — S60521A Blister (nonthermal) of right hand, initial encounter: Secondary | ICD-10-CM | POA: Insufficient documentation

## 2017-05-17 DIAGNOSIS — Y999 Unspecified external cause status: Secondary | ICD-10-CM | POA: Insufficient documentation

## 2017-05-17 DIAGNOSIS — X58XXXA Exposure to other specified factors, initial encounter: Secondary | ICD-10-CM | POA: Insufficient documentation

## 2017-05-17 DIAGNOSIS — J45909 Unspecified asthma, uncomplicated: Secondary | ICD-10-CM | POA: Insufficient documentation

## 2017-05-17 DIAGNOSIS — F17200 Nicotine dependence, unspecified, uncomplicated: Secondary | ICD-10-CM | POA: Insufficient documentation

## 2017-05-17 DIAGNOSIS — Y939 Activity, unspecified: Secondary | ICD-10-CM | POA: Insufficient documentation

## 2017-05-17 DIAGNOSIS — Z79899 Other long term (current) drug therapy: Secondary | ICD-10-CM | POA: Insufficient documentation

## 2017-05-17 MED ORDER — CLOTRIMAZOLE 1 % EX CREA: TOPICAL_CREAM | CUTANEOUS | 0 refills | Status: DC

## 2017-05-17 MED ORDER — DOXYCYCLINE HYCLATE 100 MG PO CAPS: 100.0000 mg | ORAL_CAPSULE | Freq: Two times a day (BID) | ORAL | 0 refills | Status: DC

## 2017-05-17 NOTE — Discharge Instructions (Signed)
Keep area clean and dry. Do not open blister. Use the fungal cream for the rash on your back. Follow up with a primary care doctor. Return to the ED if you develop new or worsening symptoms.

## 2017-05-17 NOTE — ED Triage Notes (Signed)
Blister to base of left index finger

## 2017-05-17 NOTE — ED Provider Notes (Signed)
[facility]-EMERGENCY DEPT Provider Note   CSN: [id number] Arrival date & time: 05/17/17  0250     History   Chief Complaint Chief Complaint  Patient presents with  . Hand Pain    blister    HPI Alexa Taylor is a 20 y.o. female.  Patient presents with pain and redness to blister at the base of her left index finger. She reports it is been there for the past 2 days and she has a history of "stress blisters". Denies any direct trauma. She became concerned tonight because the area became more painful and there is redness surrounding the blister. She reports pain that radiates into her hand and index finger. Denies fever or vomiting. She did attempt to drain the blister with a sterile needle at home. States she drained clear fluid which immediately reaccumulated.   The history is provided by the patient.  Hand Pain  Pertinent negatives include no chest pain, no abdominal pain, no headaches and no shortness of breath.    Past Medical History:  Diagnosis Date  . Asthma   . Seasonal allergies     There are no active problems to display for this patient.   History reviewed. No pertinent surgical history.  OB History    No data available       Home Medications    Prior to Admission medications   Medication Sig Start Date End Date Taking? Authorizing Provider  albuterol (PROVENTIL HFA;VENTOLIN HFA) 108 (90 BASE) MCG/ACT inhaler Inhale 2 puffs into the lungs every 6 (six) hours as needed. For shortness of breath    [provider]  albuterol (PROVENTIL HFA;VENTOLIN HFA) 108 (90 BASE) MCG/ACT inhaler Inhale 2 puffs into the lungs every 4 (four) hours as needed for wheezing. 07/04/12 08/10/12  Truddie Coco, DO  cyclobenzaprine (FLEXERIL) 10 MG tablet Take 1 tablet (10 mg total) by mouth 3 (three) times daily. 05/03/17   Ivery Quale, PA-C  dextromethorphan-guaiFENesin [facility] DM) 30-600 MG per 12 hr tablet Take 1 tablet by mouth every 12 (twelve) hours.    [provider]  ibuprofen (ADVIL,MOTRIN) 600 MG tablet Take 1 tablet (600 mg total) by mouth 4 (four) times daily. 05/03/17   Ivery Quale, PA-C  naproxen (NAPROSYN) 500 MG tablet Take 1 po BID with food prn pain 09/05/15   Devoria Albe, MD  triamcinolone cream (KENALOG) 0.1 % Apply 1 application topically 3 (three) times daily. 05/03/17   Ivery Quale, PA-C    Family History No family history on file.  Social History Social History  Substance Use Topics  . Smoking status: Current Every Day Smoker  . Smokeless tobacco: Never Used  . Alcohol use Yes     Comment: occ     Allergies   Sulfa antibiotics   Review of Systems Review of Systems  Constitutional: Negative for activity change, appetite change and fever.  HENT: Negative for congestion and rhinorrhea.   Respiratory: Negative for cough, chest tightness and shortness of breath.   Cardiovascular: Negative for chest pain.  Gastrointestinal: Negative for abdominal pain, nausea and vomiting.  Genitourinary: Negative for dysuria, hematuria, vaginal bleeding and vaginal discharge.  Musculoskeletal: Negative for arthralgias, back pain and myalgias.  Skin: Positive for rash and wound.  Neurological: Negative for dizziness, light-headedness and headaches.    all other systems are negative except as noted in the HPI and PMH.    Physical Exam Updated Vital Signs BP (!) 140/99   Pulse 72   Temp 98.7 F (37.1  C)   Resp 20   Ht 5\' 10"  (1.778 m)   Wt 106.6 kg (235 lb)   SpO2 100%   BMI 33.72 kg/m   Physical Exam  Constitutional: She is oriented to person, place, and time. She appears well-developed and well-nourished. No distress.  HENT:  Head: Normocephalic and atraumatic.  Mouth/Throat: Oropharynx is clear and moist. No oropharyngeal exudate.  Eyes: Pupils are equal, round, and reactive to light. Conjunctivae and EOM are normal.  Neck: Normal range of motion. Neck supple.  No meningismus.  Cardiovascular: Normal rate,  regular rhythm, normal heart sounds and intact distal pulses.   No murmur heard. Pulmonary/Chest: Effort normal and breath sounds normal. No respiratory distress.  Abdominal: Soft. There is no tenderness. There is no rebound and no guarding.  Musculoskeletal: Normal range of motion. She exhibits no edema or tenderness.  0.5 cm bulla at base of left index finger on palmar surface. Mild surrounding erythema. No fluctuance. FROM MCP, DIP, PIP jointrs.  Neurological: She is alert and oriented to person, place, and time. No cranial nerve deficit. She exhibits normal muscle tone. Coordination normal.   5/5 strength throughout. CN 2-12 intact.Equal grip strength.   Skin: Skin is warm. Rash noted.  2 cm erythematous scaly  Patch to L low back  Psychiatric: She has a normal mood and affect. Her behavior is normal.  Nursing note and vitals reviewed.    ED Treatments / Results  Labs (all labs ordered are listed, but only abnormal results are displayed) Labs Reviewed - No data to display  EKG  EKG Interpretation None       Radiology No results found.  Procedures Procedures (including critical care time)  Medications Ordered in ED Medications - No data to display   Initial Impression / Assessment and Plan / ED Course  I have reviewed the triage vital signs and the nursing notes.  Pertinent labs & imaging results that were available during my care of the patient were reviewed by me and considered in my medical decision making (see chart for details).    Patient with blister to the base of her left index finger with mild surrounding erythema. No fluctuance or evidence of abscess. No neurovascular compromise.  We'll treat supportively with local wound care. We'll also start antibiotics given the erythema and patient's recent manipulation of the site.  Treat possible ringworm to left back with antifungals. Needs to establish care with PCP. Return precautions discussed.  Final  Clinical Impressions(s) / ED Diagnoses   Final diagnoses:  Blister    New Prescriptions New Prescriptions   No medications on file     Glynn Octaveancour, Saxton Chain, MD 05/17/17 [phone]

## 2017-07-06 ENCOUNTER — Emergency Department (HOSPITAL_COMMUNITY)
Admission: EM | Admit: 2017-07-06 | Discharge: 2017-07-06 | Disposition: A | Discharge status: Home / Self Care | Payer: Self-pay | Attending: Emergency Medicine | Admitting: Emergency Medicine

## 2017-07-06 ENCOUNTER — Encounter (HOSPITAL_COMMUNITY): Payer: Self-pay | Admitting: *Deleted

## 2017-07-06 ENCOUNTER — Emergency Department (HOSPITAL_COMMUNITY): Payer: Self-pay

## 2017-07-06 DIAGNOSIS — M791 Myalgia, unspecified site: Secondary | ICD-10-CM | POA: Insufficient documentation

## 2017-07-06 DIAGNOSIS — E876 Hypokalemia: Secondary | ICD-10-CM | POA: Insufficient documentation

## 2017-07-06 DIAGNOSIS — R0789 Other chest pain: Secondary | ICD-10-CM | POA: Insufficient documentation

## 2017-07-06 DIAGNOSIS — J45909 Unspecified asthma, uncomplicated: Secondary | ICD-10-CM | POA: Insufficient documentation

## 2017-07-06 DIAGNOSIS — M79604 Pain in right leg: Secondary | ICD-10-CM | POA: Insufficient documentation

## 2017-07-06 DIAGNOSIS — R0602 Shortness of breath: Secondary | ICD-10-CM | POA: Insufficient documentation

## 2017-07-06 DIAGNOSIS — R531 Weakness: Secondary | ICD-10-CM | POA: Insufficient documentation

## 2017-07-06 DIAGNOSIS — R030 Elevated blood-pressure reading, without diagnosis of hypertension: Secondary | ICD-10-CM | POA: Insufficient documentation

## 2017-07-06 DIAGNOSIS — F1910 Other psychoactive substance abuse, uncomplicated: Secondary | ICD-10-CM | POA: Insufficient documentation

## 2017-07-06 DIAGNOSIS — Z87891 Personal history of nicotine dependence: Secondary | ICD-10-CM | POA: Insufficient documentation

## 2017-07-06 LAB — RAPID URINE DRUG SCREEN, HOSP PERFORMED
AMPHETAMINES: POSITIVE — AB
BARBITURATES: NOT DETECTED
BENZODIAZEPINES: NOT DETECTED
Cocaine: NOT DETECTED
Opiates: NOT DETECTED
Tetrahydrocannabinol: NOT DETECTED

## 2017-07-06 LAB — CBC WITH DIFFERENTIAL/PLATELET
Basophils Absolute: 0 10*3/uL (ref 0.0–0.1)
Basophils Relative: 0 %
Eosinophils Absolute: 0.1 10*3/uL (ref 0.0–0.7)
Eosinophils Relative: 1 %
HEMATOCRIT: 42.7 % (ref 36.0–46.0)
HEMOGLOBIN: 14.3 g/dL (ref 12.0–15.0)
LYMPHS ABS: 2.7 10*3/uL (ref 0.7–4.0)
Lymphocytes Relative: 42 %
MCH: 28.3 pg (ref 26.0–34.0)
MCHC: 33.5 g/dL (ref 30.0–36.0)
MCV: 84.4 fL (ref 78.0–100.0)
MONOS PCT: 7 %
Monocytes Absolute: 0.5 10*3/uL (ref 0.1–1.0)
NEUTROS ABS: 3.2 10*3/uL (ref 1.7–7.7)
NEUTROS PCT: 50 %
Platelets: 219 10*3/uL (ref 150–400)
RBC: 5.06 MIL/uL (ref 3.87–5.11)
RDW: 14 % (ref 11.5–15.5)
WBC: 6.5 10*3/uL (ref 4.0–10.5)

## 2017-07-06 LAB — COMPREHENSIVE METABOLIC PANEL
ALBUMIN: 4.4 g/dL (ref 3.5–5.0)
ALT: 20 U/L (ref 14–54)
ANION GAP: 10 (ref 5–15)
AST: 21 U/L (ref 15–41)
Alkaline Phosphatase: 69 U/L (ref 38–126)
BUN: 8 mg/dL (ref 6–20)
CHLORIDE: 108 mmol/L (ref 101–111)
CO2: 25 mmol/L (ref 22–32)
Calcium: 9.9 mg/dL (ref 8.9–10.3)
Creatinine, Ser: 0.81 mg/dL (ref 0.44–1.00)
GFR calc non Af Amer: >60 mL/min (ref >60)
GLUCOSE: 99 mg/dL (ref 65–99)
Potassium: 3.4 mmol/L — ABNORMAL LOW (ref 3.5–5.1)
SODIUM: 143 mmol/L (ref 135–145)
Total Bilirubin: 0.7 mg/dL (ref 0.3–1.2)
Total Protein: 8.3 g/dL — ABNORMAL HIGH (ref 6.5–8.1)

## 2017-07-06 LAB — TROPONIN I

## 2017-07-06 LAB — CK: Total CK: 41 U/L (ref 38–234)

## 2017-07-06 LAB — D-DIMER, QUANTITATIVE: D-Dimer, Quant: 0.42 ug/mL-FEU (ref 0.00–0.50)

## 2017-07-06 LAB — POC URINE PREG, ED: Preg Test, Ur: NEGATIVE

## 2017-07-06 MED ORDER — SODIUM CHLORIDE 0.9 % IV BOLUS (SEPSIS)
2000.0000 mL | Freq: Once | INTRAVENOUS | Status: AC
Start: 2017-07-06 — End: 2017-07-06
  Administered 2017-07-06: 2000 mL via INTRAVENOUS

## 2017-07-06 MED ORDER — POTASSIUM CHLORIDE CRYS ER 20 MEQ PO TBCR
40.0000 meq | EXTENDED_RELEASE_TABLET | Freq: Once | ORAL | Status: AC
  Administered 2017-07-06: 40 meq via ORAL
  Filled 2017-07-06: qty 2

## 2017-07-06 MED ORDER — ONDANSETRON HCL 4 MG/2ML IJ SOLN
4.0000 mg | Freq: Once | INTRAMUSCULAR | Status: AC
  Administered 2017-07-06: 4 mg via INTRAVENOUS
  Filled 2017-07-06: qty 2

## 2017-07-06 NOTE — Discharge Instructions (Signed)
Don't take any medication that is not prescribed for you. You can call the number on these instructions to get a primary care physician. Your blood pressure should be rechecked within the next 3 weeks. Today's was mildly elevated at 137/96 .You can call any of the numbers on the resource guide to get a psychiatrist.
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
## 2017-07-06 NOTE — ED Triage Notes (Signed)
Pt brought in by [facility] for c/o bilateral leg pain and chest pain; pt states she walked last week for about  4 hours and has been having pain and swelling to bilateral feet; pt also c/o chest pain with ems while en route by ems; ems administered one nitro SL and 324mg  asa; pt states her chest pain is gone

## 2017-07-06 NOTE — ED Notes (Signed)
Pt states understanding of care given and follow up instructions.  Pt a.o ambulated to room 2 where significant other is a pt at this time.

## 2017-08-01 ENCOUNTER — Other Ambulatory Visit: Payer: Self-pay

## 2017-08-01 ENCOUNTER — Emergency Department (HOSPITAL_BASED_OUTPATIENT_CLINIC_OR_DEPARTMENT_OTHER): Payer: No Typology Code available for payment source

## 2017-08-01 ENCOUNTER — Emergency Department (HOSPITAL_BASED_OUTPATIENT_CLINIC_OR_DEPARTMENT_OTHER)
Admission: EM | Admit: 2017-08-01 | Discharge: 2017-08-02 | Disposition: A | Discharge status: Home / Self Care | Payer: No Typology Code available for payment source | Attending: Emergency Medicine | Admitting: Emergency Medicine

## 2017-08-01 ENCOUNTER — Encounter (HOSPITAL_BASED_OUTPATIENT_CLINIC_OR_DEPARTMENT_OTHER): Payer: Self-pay

## 2017-08-01 DIAGNOSIS — Y9389 Activity, other specified: Secondary | ICD-10-CM | POA: Insufficient documentation

## 2017-08-01 DIAGNOSIS — M25521 Pain in right elbow: Secondary | ICD-10-CM | POA: Diagnosis not present

## 2017-08-01 DIAGNOSIS — Y92410 Unspecified street and highway as the place of occurrence of the external cause: Secondary | ICD-10-CM | POA: Diagnosis not present

## 2017-08-01 DIAGNOSIS — Y999 Unspecified external cause status: Secondary | ICD-10-CM | POA: Diagnosis not present

## 2017-08-01 DIAGNOSIS — J45909 Unspecified asthma, uncomplicated: Secondary | ICD-10-CM | POA: Diagnosis not present

## 2017-08-01 DIAGNOSIS — Z87891 Personal history of nicotine dependence: Secondary | ICD-10-CM | POA: Insufficient documentation

## 2017-08-01 MED ORDER — ACETAMINOPHEN 325 MG PO TABS
650.0000 mg | ORAL_TABLET | Freq: Once | ORAL | Status: AC
  Administered 2017-08-01: 650 mg via ORAL
  Filled 2017-08-01: qty 2
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
## 2017-08-01 NOTE — ED Notes (Signed)
Pt states she now has a pounding headache.

## 2017-08-02 NOTE — ED Notes (Signed)
Pt verbalizes understanding of d/c instructions and denies any further needs at this time. 

## 2017-08-06 ENCOUNTER — Emergency Department (HOSPITAL_COMMUNITY)
Admission: EM | Admit: 2017-08-06 | Discharge: 2017-08-07 | Disposition: A | Discharge status: Other Acute Inpatient Hospital | Payer: Self-pay | Attending: Emergency Medicine | Admitting: Emergency Medicine

## 2017-08-06 ENCOUNTER — Encounter (HOSPITAL_COMMUNITY): Payer: Self-pay | Admitting: *Deleted

## 2017-08-06 ENCOUNTER — Other Ambulatory Visit: Payer: Self-pay

## 2017-08-06 DIAGNOSIS — S50819A Abrasion of unspecified forearm, initial encounter: Secondary | ICD-10-CM | POA: Insufficient documentation

## 2017-08-06 DIAGNOSIS — Y929 Unspecified place or not applicable: Secondary | ICD-10-CM | POA: Insufficient documentation

## 2017-08-06 DIAGNOSIS — F17210 Nicotine dependence, cigarettes, uncomplicated: Secondary | ICD-10-CM | POA: Insufficient documentation

## 2017-08-06 DIAGNOSIS — F329 Major depressive disorder, single episode, unspecified: Secondary | ICD-10-CM | POA: Insufficient documentation

## 2017-08-06 DIAGNOSIS — J45909 Unspecified asthma, uncomplicated: Secondary | ICD-10-CM | POA: Insufficient documentation

## 2017-08-06 DIAGNOSIS — Y939 Activity, unspecified: Secondary | ICD-10-CM | POA: Insufficient documentation

## 2017-08-06 DIAGNOSIS — F419 Anxiety disorder, unspecified: Secondary | ICD-10-CM | POA: Insufficient documentation

## 2017-08-06 DIAGNOSIS — X789XXA Intentional self-harm by unspecified sharp object, initial encounter: Secondary | ICD-10-CM | POA: Insufficient documentation

## 2017-08-06 DIAGNOSIS — S1091XA Abrasion of unspecified part of neck, initial encounter: Secondary | ICD-10-CM | POA: Insufficient documentation

## 2017-08-06 DIAGNOSIS — Y999 Unspecified external cause status: Secondary | ICD-10-CM | POA: Insufficient documentation

## 2017-08-06 DIAGNOSIS — R45851 Suicidal ideations: Secondary | ICD-10-CM | POA: Insufficient documentation

## 2017-08-06 LAB — COMPREHENSIVE METABOLIC PANEL
ALBUMIN: 4.7 g/dL (ref 3.5–5.0)
ALK PHOS: 72 U/L (ref 38–126)
ALT: 42 U/L (ref 14–54)
ANION GAP: 12 (ref 5–15)
AST: 30 U/L (ref 15–41)
BILIRUBIN TOTAL: 1.4 mg/dL — AB (ref 0.3–1.2)
BUN: 14 mg/dL (ref 6–20)
CALCIUM: 9.6 mg/dL (ref 8.9–10.3)
CO2: 20 mmol/L — AB (ref 22–32)
CREATININE: 0.9 mg/dL (ref 0.44–1.00)
Chloride: 106 mmol/L (ref 101–111)
GFR calc non Af Amer: >60 mL/min (ref >60)
GLUCOSE: 95 mg/dL (ref 65–99)
Potassium: 3.5 mmol/L (ref 3.5–5.1)
SODIUM: 138 mmol/L (ref 135–145)
TOTAL PROTEIN: 8.2 g/dL — AB (ref 6.5–8.1)

## 2017-08-06 LAB — CBC
HEMATOCRIT: 46.7 % — AB (ref 36.0–46.0)
HEMOGLOBIN: 15.8 g/dL — AB (ref 12.0–15.0)
MCH: 28.7 pg (ref 26.0–34.0)
MCHC: 33.8 g/dL (ref 30.0–36.0)
MCV: 84.8 fL (ref 78.0–100.0)
Platelets: 241 10*3/uL (ref 150–400)
RBC: 5.51 MIL/uL — ABNORMAL HIGH (ref 3.87–5.11)
RDW: 14.1 % (ref 11.5–15.5)
WBC: 8.2 10*3/uL (ref 4.0–10.5)

## 2017-08-06 LAB — PREGNANCY, URINE: Preg Test, Ur: NEGATIVE

## 2017-08-06 LAB — RAPID URINE DRUG SCREEN, HOSP PERFORMED
Amphetamines: POSITIVE — AB
BARBITURATES: NOT DETECTED
Benzodiazepines: NOT DETECTED
Cocaine: NOT DETECTED
Opiates: NOT DETECTED
TETRAHYDROCANNABINOL: NOT DETECTED

## 2017-08-06 LAB — SALICYLATE LEVEL: Salicylate Lvl: <7 mg/dL (ref 2.8–30.0)

## 2017-08-06 LAB — ETHANOL: Alcohol, Ethyl (B): <10 mg/dL (ref <10)

## 2017-08-06 LAB — ACETAMINOPHEN LEVEL: Acetaminophen (Tylenol), Serum: <10 ug/mL — ABNORMAL LOW (ref 10–30)

## 2017-08-06 MED ORDER — IBUPROFEN 400 MG PO TABS
600.0000 mg | ORAL_TABLET | Freq: Three times a day (TID) | ORAL | Status: DC | PRN
  Administered 2017-08-07: 600 mg via ORAL
  Filled 2017-08-06: qty 2

## 2017-08-06 NOTE — ED Notes (Signed)
Pt's belongings placed in 3 separate lockers, jewelry removed, security wanded pt.
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
## 2017-08-07 NOTE — Progress Notes (Addendum)
Accepted to [facility] [place], per Christiane HaJonathan in OV intake department.    Accepting/attending physician is Dr. Wendall StadeKohl.  [facility].  Nurse to Nurse report number is [phone] Bed is ready, patient can transport at anytime.    Vernell BarrierLeslie Cardwell, RN notified.    Baldo DaubJolan Loree Shehata MSW, LCSWA CSW Disposition [phone]
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
## 2017-08-07 NOTE — ED Notes (Signed)
Pt c/o a headache and pain to the abrasions under her neck. Will medicate pt.
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
## 2017-08-07 NOTE — ED Notes (Signed)
Report given, paper work complete, [facility]

## 2017-08-07 NOTE — ED Notes (Addendum)
Per Ala DachFord, pt meets inpatient criteria. Behavioral health is full at this time so they will continue to look for other placement.

## 2017-08-07 NOTE — Progress Notes (Signed)
Patient meets criteria for inpatient treatment. CSW faxed referrals to the following inpatient facilities for review:  [facility], [facility], [facility], [facility], [address] [address], [address], [facility], [facility], [facility]   TTS will continue to seek bed placement.   Baldo DaubJolan Mamie Hundertmark MSW, LCSWA CSW Disposition [phone]

## 2017-08-07 NOTE — ED Notes (Signed)
[facility] here for transport, belonging given to driver

## 2018-02-12 ENCOUNTER — Encounter (HOSPITAL_BASED_OUTPATIENT_CLINIC_OR_DEPARTMENT_OTHER): Payer: Self-pay | Admitting: *Deleted

## 2018-02-12 ENCOUNTER — Other Ambulatory Visit: Payer: Self-pay

## 2018-02-12 ENCOUNTER — Emergency Department (HOSPITAL_BASED_OUTPATIENT_CLINIC_OR_DEPARTMENT_OTHER)
Admission: EM | Admit: 2018-02-12 | Discharge: 2018-02-12 | Disposition: A | Discharge status: Home / Self Care | Payer: Self-pay | Attending: Emergency Medicine | Admitting: Emergency Medicine

## 2018-02-12 DIAGNOSIS — B9789 Other viral agents as the cause of diseases classified elsewhere: Secondary | ICD-10-CM

## 2018-02-12 DIAGNOSIS — J069 Acute upper respiratory infection, unspecified: Secondary | ICD-10-CM | POA: Insufficient documentation

## 2018-02-12 DIAGNOSIS — F17200 Nicotine dependence, unspecified, uncomplicated: Secondary | ICD-10-CM | POA: Insufficient documentation

## 2018-02-12 DIAGNOSIS — J45909 Unspecified asthma, uncomplicated: Secondary | ICD-10-CM | POA: Insufficient documentation

## 2018-02-12 MED ORDER — IBUPROFEN 800 MG PO TABS: 800.0000 mg | ORAL_TABLET | Freq: Three times a day (TID) | ORAL | 0 refills | Status: AC

## 2018-02-12 MED ORDER — ONDANSETRON 4 MG PO TBDP: 4.0000 mg | ORAL_TABLET | ORAL | 0 refills | Status: AC | PRN

## 2018-02-12 MED ORDER — FAMOTIDINE 20 MG PO TABS: 20.0000 mg | ORAL_TABLET | Freq: Two times a day (BID) | ORAL | 0 refills | Status: AC

## 2018-02-12 NOTE — Discharge Instructions (Signed)
1.  Take ibuprofen for headache and body aches.  You may also take Tylenol severe cold and sinus if you have a lot of nasal congestion and drainage (use as per package instructions). 2.  Pepcid twice daily for burning discomfort and nausea in the stomach.  You may take Zofran in addition, if needed for nausea or vomiting. 3.  Return to the emergency department if your symptoms are worsening or changing.  You should have a family doctor for follow-up evaluation.  If you do not have one use the referral number in your discharge instructions to find one.

## 2018-02-12 NOTE — ED Provider Notes (Signed)
[facility] EMERGENCY DEPARTMENT Provider Note   CSN: [id number] Arrival date & time: 02/12/18  1534     History   Chief Complaint Chief Complaint  Patient presents with  . Cough    HPI Alexa Taylor is a 22 y.o. female.  HPI Patient is being seen nasal congestion, headache, cough.  Her fianc is also being seen for similar symptoms.  She reports her fianc symptoms started a little earlier and seemed to gotten slightly worse.  Patient reports she is had a lot of nasal congestion and drainage along with pressure behind her forehead.  She is felt achy in the upper body.  She is had occasional cough.  Cough is sometimes been productive of sputum.  Reports she is felt hot and cold but has not had documented fever.  Nausea but no vomiting.  She reports she gets, burning sensation in her epigastrium and up into her throat and chest.  Sore throat is burning in quality.  Patient denies any history of drug abuse or IV drug abuse.  Significant medical history except asthma.  She reports her asthma has not been a problem for over a year.  She has not felt wheezy or short of breath. Past Medical History:  Diagnosis Date  . Asthma   . Seasonal allergies     There are no active problems to display for this patient.   History reviewed. No pertinent surgical history.   OB History   None      Home Medications    Prior to Admission medications   Medication Sig Start Date End Date Taking? Authorizing Provider  amphetamine-dextroamphetamine (ADDERALL) 10 MG tablet Take 10 mg by mouth once.    [provider]  famotidine (PEPCID) 20 MG tablet Take 1 tablet (20 mg total) by mouth 2 (two) times daily. 02/12/18   Arby BarrettePfeiffer, Deandrae Wajda, MD  ibuprofen (ADVIL,MOTRIN) 800 MG tablet Take 1 tablet (800 mg total) by mouth 3 (three) times daily. 02/12/18   Arby BarrettePfeiffer, Grant Henkes, MD  ondansetron (ZOFRAN ODT) 4 MG disintegrating tablet Take 1 tablet (4 mg total) by mouth every 4 (four) hours as needed  for nausea or vomiting. 02/12/18   Arby BarrettePfeiffer, Jodeen Mclin, MD    Family History No family history on file.  Social History Social History   Tobacco Use  . Smoking status: Current Some Day Smoker  . Smokeless tobacco: Never Used  Substance Use Topics  . Alcohol use: No    Frequency: Never  . Drug use: No     Allergies   Sulfa antibiotics   Review of Systems Review of Systems 10 Systems reviewed and are negative for acute change except as noted in the HPI.   Physical Exam Updated Vital Signs BP 139/85   Pulse 83   Temp 98.6 F (37 C) (Oral)   Resp 18   Ht 5\' 10"  (1.778 m)   Wt 104.3 kg (230 lb)   SpO2 100%   BMI 33.00 kg/m   Physical Exam  Constitutional: She is oriented to person, place, and time. She appears well-developed and well-nourished.  HENT:  Head: Normocephalic and atraumatic.  Nose: Nose normal.  Mouth/Throat: Oropharynx is clear and moist.  TMs no erythema.  Patient does have partial cerumen impaction bilaterally.  This is not completely occlusive.  The oropharynx widely patent.  No erythema or exudate of the posterior tonsillar pillars.  His membranes are pink and moist.  Eyes: Pupils are equal, round, and reactive to light. EOM are normal.  Neck: Neck supple.  Cardiovascular: Normal rate, regular rhythm, normal heart sounds and intact distal pulses.  Pulmonary/Chest: Effort normal and breath sounds normal.  Abdominal: Soft. Bowel sounds are normal. She exhibits no distension. There is no tenderness.  Musculoskeletal: Normal range of motion. She exhibits no edema.  Neurological: She is alert and oriented to person, place, and time. She has normal strength. She exhibits normal muscle tone. Coordination normal. GCS eye subscore is 4. GCS verbal subscore is 5. GCS motor subscore is 6.  Skin: Skin is warm, dry and intact.  Psychiatric: She has a normal mood and affect.     ED Treatments / Results  Labs (all labs ordered are listed, but only abnormal  results are displayed) Labs Reviewed - No data to display  EKG None  Radiology No results found.  Procedures Procedures (including critical care time)  Medications Ordered in ED Medications - No data to display   Initial Impression / Assessment and Plan / ED Course  I have reviewed the triage vital signs and the nursing notes.  Pertinent labs & imaging results that were available during my care of the patient were reviewed by me and considered in my medical decision making (see chart for details).       Final Clinical Impressions(s) / ED Diagnoses   Final diagnoses:  Viral URI with cough  Patient is nontoxic and otherwise well in appearance.  No wheezing on pulmonary exam.  No dyspnea.  Patient has direct sick contacts.  Findings are consistent with viral upper respiratory illness.  Patient also reports some nausea but no vomiting or diarrhea.  Some burning epigastric pain.  Will treat symptomatically with Pepcid and Zofran.  Symptomatic treatment for body ache headache and nasal congestion with ibuprofen and patient is counseled on over the counter use of Tylenol cold and sinus for decongestant.  Return precautions reviewed.  ED Discharge Orders        Ordered    ibuprofen (ADVIL,MOTRIN) 800 MG tablet  3 times daily     02/12/18 1745    ondansetron (ZOFRAN ODT) 4 MG disintegrating tablet  Every 4 hours PRN     02/12/18 1745    famotidine (PEPCID) 20 MG tablet  2 times daily     02/12/18 1745       Arby Barrette, MD 02/12/18 1753

## 2018-02-12 NOTE — ED Triage Notes (Signed)
Cough, body aches, and sneezing x 4 days.

## 2018-08-05 ENCOUNTER — Emergency Department (HOSPITAL_BASED_OUTPATIENT_CLINIC_OR_DEPARTMENT_OTHER)
Admission: EM | Admit: 2018-08-05 | Discharge: 2018-08-05 | Disposition: A | Discharge status: Home / Self Care | Payer: Self-pay | Attending: Emergency Medicine | Admitting: Emergency Medicine

## 2018-08-05 ENCOUNTER — Other Ambulatory Visit: Payer: Self-pay

## 2018-08-05 ENCOUNTER — Encounter (HOSPITAL_BASED_OUTPATIENT_CLINIC_OR_DEPARTMENT_OTHER): Payer: Self-pay | Admitting: Emergency Medicine

## 2018-08-05 DIAGNOSIS — Z5321 Procedure and treatment not carried out due to patient leaving prior to being seen by health care provider: Secondary | ICD-10-CM | POA: Insufficient documentation

## 2018-08-05 DIAGNOSIS — M545 Low back pain: Secondary | ICD-10-CM | POA: Insufficient documentation

## 2018-08-05 NOTE — ED Triage Notes (Signed)
Pt c/o lower back pain x 1 wk; no injury; severe this morning; difficulty bending, dressing, sitting;  Took 3 advil, 1 goody's back and body and muscle cream this a.m with not relief
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
